# Patient Record
Sex: Male | Born: 1980 | Race: Black or African American | Hispanic: No | Marital: Single | Smoking: Never smoker
Health system: Southern US, Community
[De-identification: ages and names within clinical notes are randomized; demographics above are authoritative.]

## PROBLEM LIST (undated history)

## (undated) DIAGNOSIS — I10 Essential (primary) hypertension: Secondary | ICD-10-CM

---

## 2003-06-28 ENCOUNTER — Emergency Department (HOSPITAL_COMMUNITY): Admission: AD | Admit: 2003-06-28 | Discharge: 2003-06-28 | Payer: Self-pay | Admitting: Family Medicine

## 2011-08-28 ENCOUNTER — Encounter (HOSPITAL_COMMUNITY): Payer: Self-pay | Admitting: Emergency Medicine

## 2011-08-28 ENCOUNTER — Emergency Department (HOSPITAL_COMMUNITY)
Admission: EM | Admit: 2011-08-28 | Discharge: 2011-08-28 | Disposition: A | Discharge status: Home / Self Care | Payer: PRIVATE HEALTH INSURANCE | Attending: Emergency Medicine | Admitting: Emergency Medicine

## 2011-08-28 DIAGNOSIS — R6884 Jaw pain: Secondary | ICD-10-CM | POA: Insufficient documentation

## 2011-08-28 DIAGNOSIS — G249 Dystonia, unspecified: Secondary | ICD-10-CM

## 2011-08-28 DIAGNOSIS — I10 Essential (primary) hypertension: Secondary | ICD-10-CM | POA: Insufficient documentation

## 2011-08-28 DIAGNOSIS — G2402 Drug induced acute dystonia: Secondary | ICD-10-CM | POA: Insufficient documentation

## 2011-08-28 MED ORDER — DIPHENHYDRAMINE HCL 50 MG/ML IJ SOLN
50.0000 mg | Freq: Once | INTRAMUSCULAR | Status: AC
  Administered 2011-08-28: 50 mg via INTRAVENOUS
  Filled 2011-08-28: qty 1

## 2011-08-28 NOTE — ED Notes (Signed)
WUJ:[id number]<JY> Expected date:08/28/11<BR> Expected time:<BR> Means of arrival:<BR> Comments:<BR> EMS 71 [facility] - jaw spasms

## 2011-08-28 NOTE — Discharge Instructions (Signed)
Mr [facility] for muscle spasm and tongue dysfunction your experience today is called dystonia. Not sure what you had a reaction to that I recommended she stop your supplements called oxyle elite. You may want to contact the company and see if this has happened to anyone else.  We gave Benadryl in your IV today. Continue taking Benadryl over-the-counter 25 mg every 6 hours x48 hours. Unlikely to take Pepcid once a day as well. You can buy both of these over the counter in the drug store or at [employer]. If the dystonic reflexes come back or get worse return to the ER. Followup with your PCP as needed.  Dystonias The dystonias are movement disorders in which sustained muscle contractions cause twisting and repetitive movements or abnormal postures. The movements, which are involuntary and sometimes painful, may affect a single muscle; a group of muscles such as those in the arms, legs, or neck; or the entire body. Early symptoms (problems) may include a deterioration in handwriting after writing several lines, foot cramps, and a tendency of one foot to pull up or drag after running or walking some distance. Other possible symptoms are tremor and voice or speech difficulties. Birth injury (particularly due to lack of oxygen), certain infections, reactions to certain drugs, heavy-metal or carbon monoxide poisoning, trauma (damage caused by an accident), or stroke can cause dystonic symptoms. About half the cases of dystonia have no connection to disease or injury and are called primary or idiopathic dystonia. Of the primary dystonias, many cases appear to be inherited in a dominant manner. Dystonias can also be symptoms of other diseases, some of which may be hereditary (passed down from parents). In some individuals, symptoms of a dystonia appear spontaneously in childhood between the ages of 5 and 28, usually in the foot or in the hand. For other individuals, the symptoms emerge in late adolescence or early  adulthood. TREATMENT  No one treatment has been found universally effective for dystonia. Instead, physicians use a variety of therapies (medications, surgery and other treatments such as physical therapy, splinting, stress management, and biofeedback), aimed at reducing or eliminating muscle spasms and pain. Since response to drugs varies among patients and even in the same person over time, the therapy must be individualized. PROGNOSIS The initial symptoms can be very mild and may be noticeable only after prolonged exertion, stress, or fatigue. Over a period of time, the symptoms may become more noticeable and widespread and be unrelenting; sometimes, however, there is little or no progression. RESEARCH BEING DONE Investigators believe that the dystonias result from an abnormality in an area of the brain called the basal ganglia, where some of the messages that initiate muscle contractions are processed. Scientists suspect a defect in the body's ability to process a group of chemicals called neurotransmitters that help cells in the brain communicate with each other. Scientists at the [facility] laboratories have conducted detailed investigations of the pattern of muscle activity in persons with dystonias. Studies using EEG analysis and neuroimaging are probing brain activity. The search for the gene or genes responsible for some forms of dominantly inherited dystonias continues. 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 In 1997, the team sequenced the DYT1 gene and found that it codes for a previously unknown protein now called "torsin A." Document Released: 02/24/2002 Document Revised: 02/23/2011 Document Reviewed: 03/06/2005 [facility] Patient Information 2012 [place], [place].

## 2011-08-28 NOTE — ED Notes (Addendum)
Per EMS, pt was working out and was lifting a heavy weight and clenching his teeth tightly.  Pt began having muscle spasms in his jaw. Pt states he went home and the spasms stopped, fell asleep briefly and when he woke up, the spasms came back. Pt also c/o pain bilaterally but worse on the left side.  Pt unable to open his mouth fully.  Pt with garbled speech d/t inability to move jaw. Pt states he took Robaxin approx 1 hr pta without any relief.

## 2011-08-28 NOTE — ED Provider Notes (Signed)
History     CSN: [id number]  Arrival date & time 08/28/11  1325   First MD Initiated Contact with Patient 08/28/11 1340      Chief Complaint  Patient presents with  . Jaw Pain    (Consider location/radiation/quality/duration/timing/severity/associated sxs/prior treatment) The history is provided by the patient and a parent. No language interpreter was used.  cc:  C/o repetative tongue movements uncontrollable x 1 hours.  Patient having difficulty speaking because his tongue is protruding from his mouth repetative times.  States that he went to work out  Clenching  his jaws when he lifted weights and that is when it started.  Having jaw pain from clenching his mouth as well.  Suspect dytonia from ? Medication.  Started a supplement called Oxyle elite 2 days ago.  Mom states that he took a robaxin and hour ago with no relief.    History reviewed. No pertinent past medical history.  History reviewed. No pertinent past surgical history.  History reviewed. No pertinent family history.  History  Substance Use Topics  . Smoking status: Never Smoker   . Smokeless tobacco: Not on file  . Alcohol Use: No      Review of Systems  Constitutional: Negative.   HENT: Negative.   Eyes: Negative.   Respiratory: Negative.   Cardiovascular: Negative.   Gastrointestinal: Negative.   Musculoskeletal:       Jaw muscle pain  Neurological: Negative.        Dystonic movments of the tongue  Psychiatric/Behavioral: Negative.   All other systems reviewed and are negative.    Allergies  Review of patient's allergies indicates no known allergies.  Home Medications   Current Outpatient Rx  Name Route Sig Dispense Refill  . ASPIRIN 325 MG PO TABS Oral Take 325 mg by mouth daily.      BP 147/85  Pulse 80  Temp(Src) 98 F (36.7 C) (Tympanic)  Resp 18  SpO2 100%  Physical Exam  Nursing note and vitals reviewed. Constitutional: He is oriented to person, place, and time. He appears  well-developed and well-nourished.  HENT:  Head: Normocephalic.  Eyes: Conjunctivae and EOM are normal. Pupils are equal, round, and reactive to light.  Neck: Normal range of motion. Neck supple.  Cardiovascular: Normal rate.   Pulmonary/Chest: Effort normal.  Abdominal: Soft.  Musculoskeletal: Normal range of motion.       Dystonia of the face and tongue.  Neurological: He is alert and oriented to person, place, and time.  Skin: Skin is warm and dry.  Psychiatric: He has a normal mood and affect.    ED Course  Procedures (including critical care time)  Labs Reviewed - No data to display No results found.   No diagnosis found.    MDM  Dystonia of the face and tongue resolved in the ER after IV benadryl.  Continue benadryl and pepcid.  Stop Oxyle elite.  Return if worse.          Remi Haggard, NP 08/29/11 1123

## 2011-08-29 NOTE — ED Provider Notes (Signed)
Medical screening examination/treatment/procedure(s) were conducted as a shared visit with non-physician practitioner(s) and myself.  I personally evaluated the patient during the encounter On my exam the patient's condition had ceased, as he had already received ED meds.  We discussed the need to avoid possible precipitants (including his supplements ) and to have his condition monitored by his PMD.  Given the resolution of Sx, he was discharged in stable condition.  Gerhard Munch, MD 08/29/11 1217

## 2018-03-29 ENCOUNTER — Ambulatory Visit (INDEPENDENT_AMBULATORY_CARE_PROVIDER_SITE_OTHER): Payer: Self-pay

## 2018-03-29 ENCOUNTER — Ambulatory Visit (HOSPITAL_COMMUNITY)
Admission: EM | Admit: 2018-03-29 | Discharge: 2018-03-29 | Disposition: A | Discharge status: Home / Self Care | Payer: Self-pay | Attending: Family Medicine | Admitting: Family Medicine

## 2018-03-29 ENCOUNTER — Encounter (HOSPITAL_COMMUNITY): Payer: Self-pay | Admitting: Emergency Medicine

## 2018-03-29 DIAGNOSIS — M25521 Pain in right elbow: Secondary | ICD-10-CM

## 2018-03-29 DIAGNOSIS — M25511 Pain in right shoulder: Secondary | ICD-10-CM | POA: Insufficient documentation

## 2018-03-29 HISTORY — DX: Essential (primary) hypertension: I10

## 2018-03-29 MED ORDER — IBUPROFEN 600 MG PO TABS: 600.0000 mg | ORAL_TABLET | Freq: Four times a day (QID) | ORAL | 0 refills | Status: AC | PRN

## 2018-03-29 NOTE — ED Triage Notes (Signed)
Pt here for fall, c/o pain in R elbow and R shoulder.

## 2018-03-29 NOTE — Discharge Instructions (Signed)
Please stay in the sling for a week  You can start range of motion exercises after that  If you are unable to extend your arm after the swelling has went away then you will need to see the orthopedist.  Please use ice on the area Please take ibuprofen for pain

## 2018-03-29 NOTE — ED Provider Notes (Signed)
[facility]    CSN: [id number] Arrival date & time: 03/29/18  1952     History   Chief Complaint Chief Complaint  Patient presents with  . Fall    HPI Lucas Chang is a 38 y.o. male.   He is presenting with right elbow and shoulder pain.  He fell yesterday landing on his right elbow.  Since that time he has had swelling of the posterior right elbow.  He has limited extension of the elbow.  He has normal range of motion of the shoulder.  The pain is moderate to severe.  He has not taken anything for the pain.  Feels like a pain is getting worse.  It is a sharp and stabbing pain.  Denies any numbness or tingling  HPI  Past Medical History:  Diagnosis Date  . Hypertension     There are no active problems to display for this patient.   History reviewed. No pertinent surgical history.     Home Medications    Prior to Admission medications   Medication Sig Start Date End Date Taking? Authorizing Provider  escitalopram (LEXAPRO) 10 MG tablet Take 10 mg by mouth daily.   Yes [provider]  losartan (COZAAR) 25 MG tablet Take 25 mg by mouth daily.   Yes [provider]  aspirin 325 MG tablet Take 325 mg by mouth daily.    [provider]  ibuprofen (ADVIL,MOTRIN) 600 MG tablet Take 1 tablet (600 mg total) by mouth every 6 (six) hours as needed. 03/29/18   Lucas Rude, MD    Family History No family history on file.  Social History Social History   Tobacco Use  . Smoking status: Never Smoker  Substance Use Topics  . Alcohol use: No  . Drug use: Not on file     Allergies   Penicillins   Review of Systems Review of Systems  Constitutional: Negative for fever.  HENT: Negative for congestion.   Respiratory: Negative for cough.   Cardiovascular: Negative for chest pain.  Gastrointestinal: Negative for abdominal pain.  Musculoskeletal: Positive for joint swelling.  Skin: Negative for color change.    Neurological: Negative for weakness.  Hematological: Negative for adenopathy.  Psychiatric/Behavioral: Negative for agitation.     Physical Exam Triage Vital Signs ED Triage Vitals  Enc Vitals Group     BP      Pulse      Resp      Temp      Temp src      SpO2      Weight      Height      Head Circumference      Peak Flow      Pain Score      Pain Loc      Pain Edu?      Excl. in GC?    No data found.  Updated Vital Signs BP (!) 159/108   Pulse 75   Temp 98.3 F (36.8 C)   Resp 18   SpO2 100%   Visual Acuity Right Eye Distance:   Left Eye Distance:   Bilateral Distance:    Right Eye Near:   Left Eye Near:    Bilateral Near:     Physical Exam Gen: NAD, alert, cooperative with exam, well-appearing ENT: normal lips, normal nasal mucosa,  Eye: normal EOM, normal conjunctiva and lids CV:  no edema, +2 pedal pulses   Resp: no accessory muscle use, non-labored,  Skin: no rashes, no areas of induration  Neuro: normal tone, normal sensation to touch Psych:  normal insight, alert and oriented MSK:  Right shoulder:  Normal active flexion and abduction  Normal strength to resistance with IR and ER  Mild pain with Empty can testing  Right elbow:  Swelling posteriorly  Normal flexion. Limited extension secondary to an obstruction. Normal supination pronation. Normal grip strength. Neurovascular intact  UC Treatments / Results  Labs (all labs ordered are listed, but only abnormal results are displayed) Labs Reviewed - No data to display  EKG None  Radiology Dg Shoulder Right  Result Date: 03/29/2018 CLINICAL DATA:  Fall with pain and swelling EXAM: RIGHT SHOULDER - 2+ VIEW COMPARISON:  None. FINDINGS: There is no evidence of fracture or dislocation. There is no evidence of arthropathy or other focal bone abnormality. Soft tissues are unremarkable. IMPRESSION: Negative. Electronically Signed   By: Lucas Chang M.D.   On: 03/29/2018 20:27   Dg Elbow  Complete Right  Result Date: 03/29/2018 CLINICAL DATA:  Fall with pain and swelling EXAM: RIGHT ELBOW - COMPLETE 3+ VIEW COMPARISON:  None. FINDINGS: No acute displaced fracture or malalignment. No significant elbow effusion. Posterior soft tissue calcification, could be dystrophic. There is diffuse edema at the posterior elbow. IMPRESSION: No acute osseous abnormality Electronically Signed   By: Lucas Chang M.D.   On: 03/29/2018 20:28    Procedures Procedures (including critical care time)  Medications Ordered in UC Medications - No data to display  Initial Impression / Assessment and Plan / UC Course  I have reviewed the triage vital signs and the nursing notes.  Pertinent labs & imaging results that were available during my care of the patient were reviewed by me and considered in my medical decision making (see chart for details).     Lucas Chang is a 38 yo M that is presenting with right shoulder and right elbow pain after a fall.  Having significant swelling posteriorly and limited extension of the elbow.  X-ray of the shoulder and elbow are read as normal.  Independent review appears to have an avulsion fracture that is extra-articular of the olecranon.  He was placed in a sling.  He was provided ibuprofen.  Counseled on supportive care and home exercise therapy.  Given indications on when to follow-up.  Final Clinical Impressions(s) / UC Diagnoses   Final diagnoses:  Acute pain of right shoulder  Right elbow pain     Discharge Instructions     Please stay in the sling for a week  You can start range of motion exercises after that  If you are unable to extend your arm after the swelling has went away then you will need to see the orthopedist.  Please use ice on the area Please take ibuprofen for pain     ED Prescriptions    Medication Sig Dispense Auth. Provider   ibuprofen (ADVIL,MOTRIN) 600 MG tablet Take 1 tablet (600 mg total) by mouth every 6 (six) hours as needed.  30 tablet Lucas Rude, MD     Controlled Substance Prescriptions [place] Controlled Substance Registry consulted? Not Applicable   Lucas Rude, MD 03/29/18 2049

## 2018-03-29 NOTE — ED Notes (Signed)
Pt given free sling 

## 2019-06-24 IMAGING — DX DG SHOULDER 2+V*R*
4 series · 4 of 4 positions shown · non-contrast
Comparison: None.

CLINICAL DATA: Fall with pain and swelling

EXAM:
RIGHT SHOULDER - 2+ VIEW

[shoulder ap]
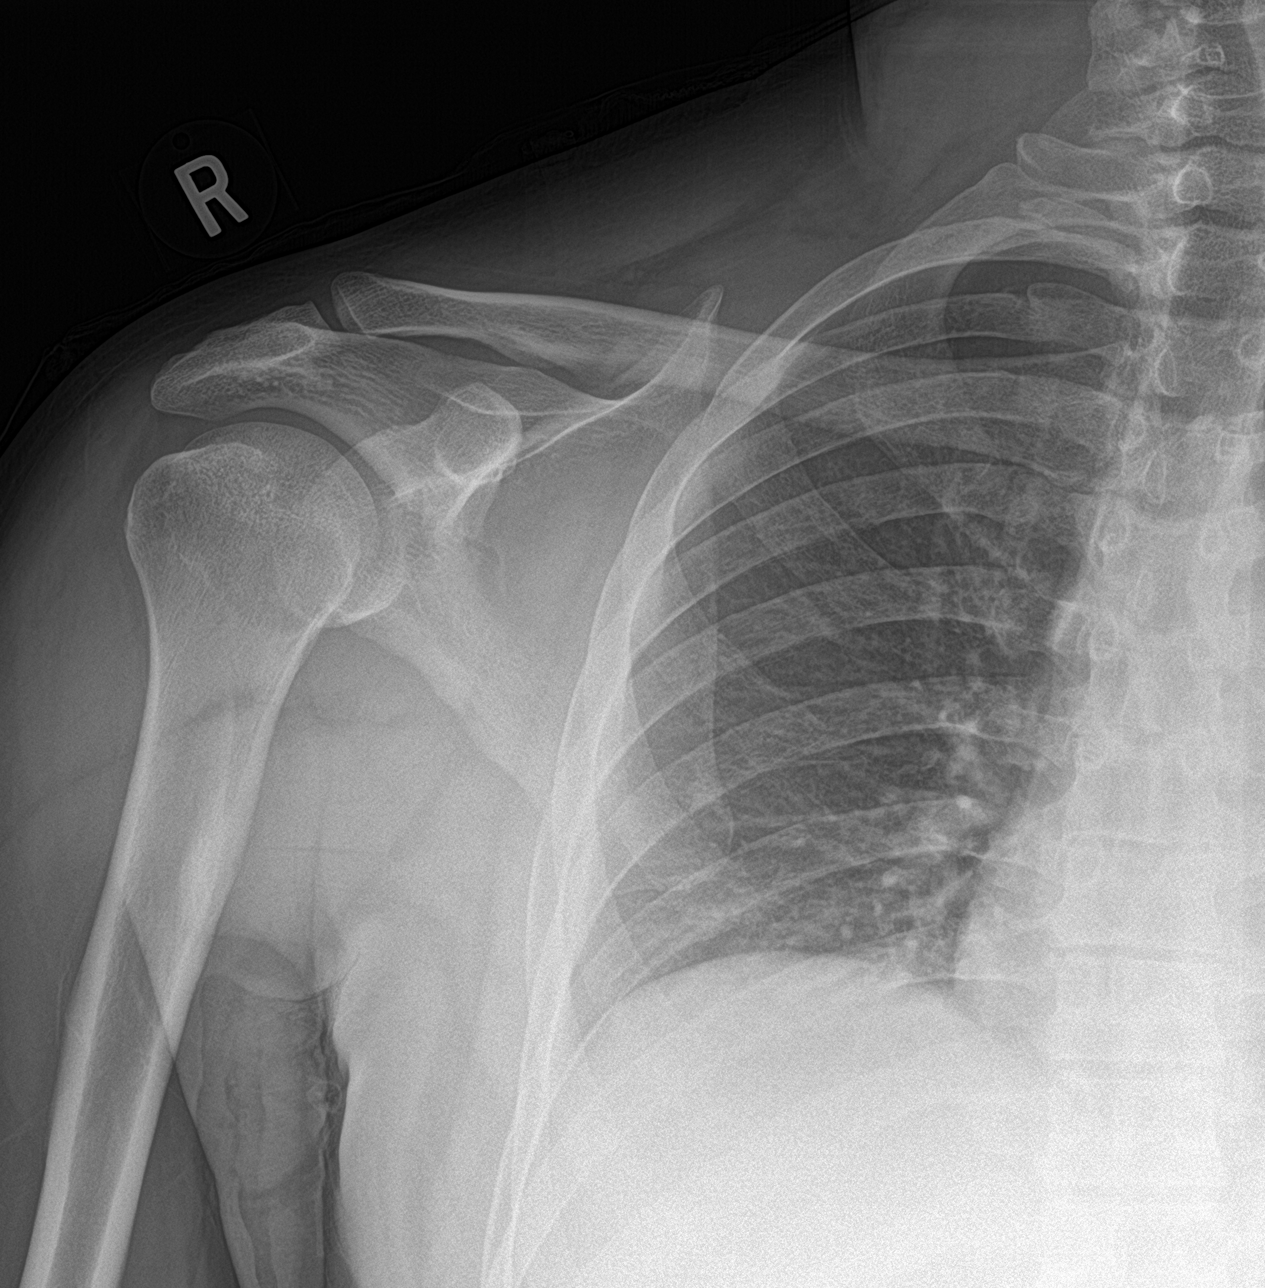

[shoulder grashey]
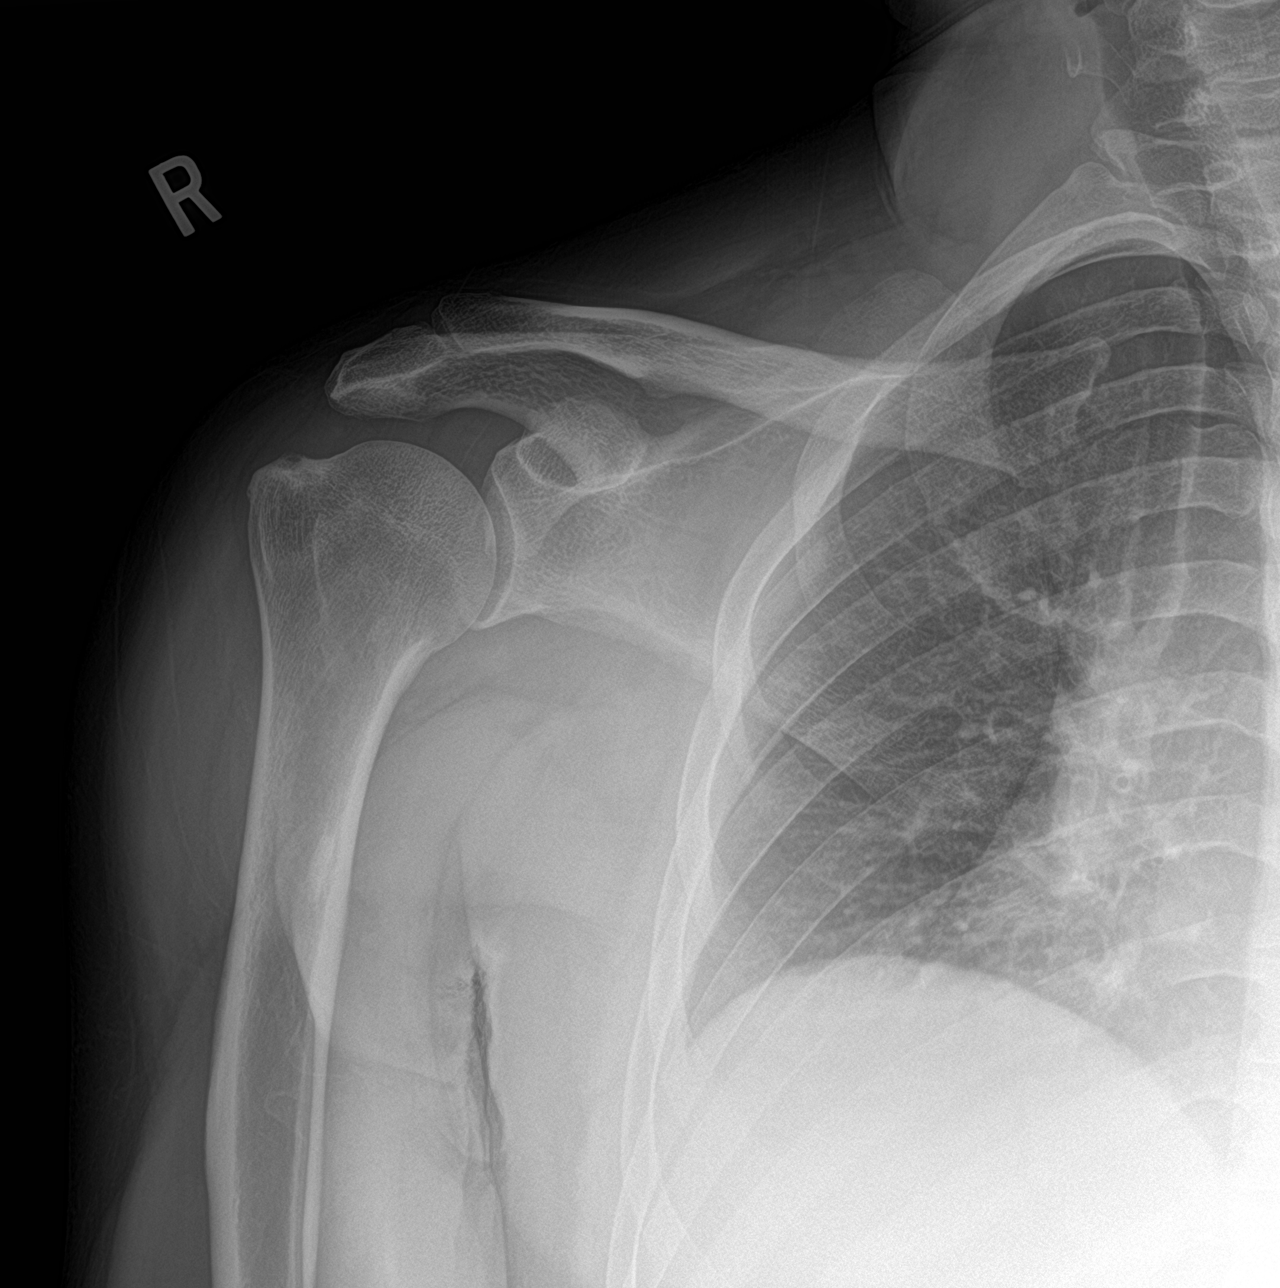

[shoulder y-view]
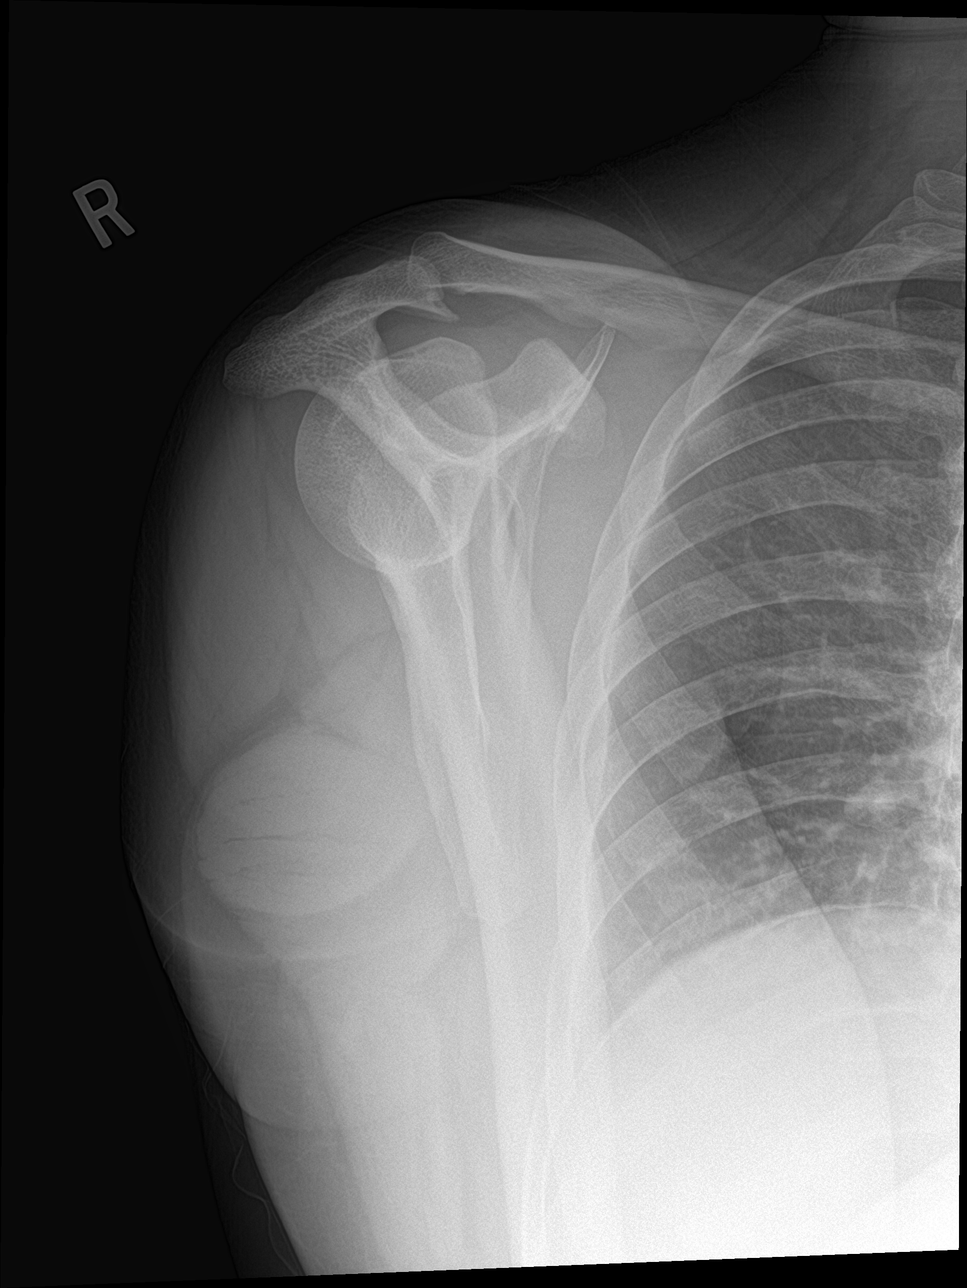

[shoulder axial]
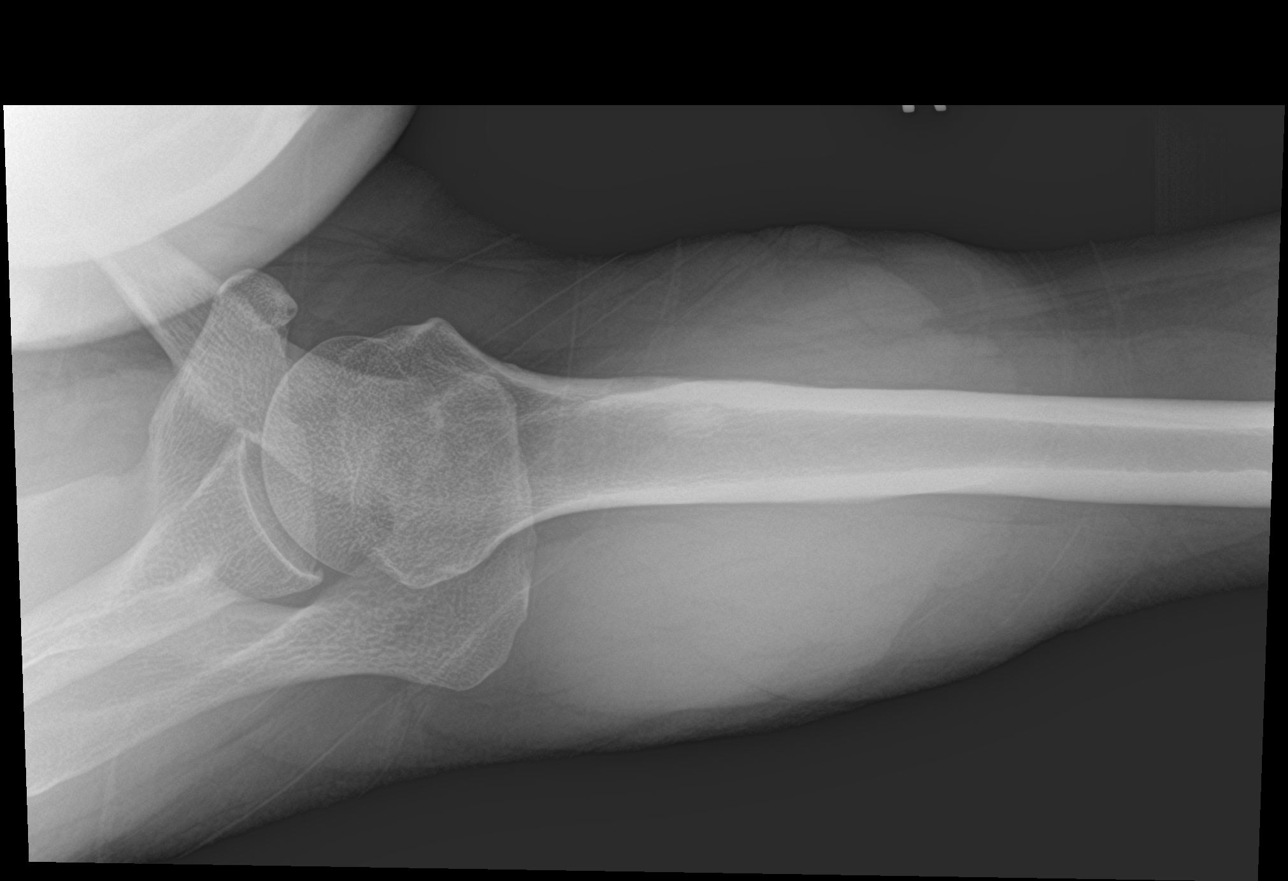

[4 of 4 positions shown; findings below may reference images not displayed]

FINDINGS: There is no evidence of fracture or dislocation. There is no
evidence of arthropathy or other focal bone abnormality. Soft
tissues are unremarkable.
IMPRESSION: Negative.
